# Patient Record
Sex: Male | Born: 1965 | Race: White | Hispanic: No | Marital: Single | Smoking: Never smoker
Health system: Southern US, Community
[De-identification: ages and names within clinical notes are randomized; demographics above are authoritative.]

---

## 2020-12-23 ENCOUNTER — Emergency Department (HOSPITAL_COMMUNITY)
Admission: EM | Admit: 2020-12-23 | Discharge: 2020-12-23 | Disposition: A | Discharge status: Home / Self Care | Payer: 59 | Attending: Emergency Medicine | Admitting: Emergency Medicine

## 2020-12-23 ENCOUNTER — Other Ambulatory Visit: Payer: Self-pay

## 2020-12-23 ENCOUNTER — Encounter (HOSPITAL_COMMUNITY): Payer: Self-pay

## 2020-12-23 ENCOUNTER — Emergency Department (HOSPITAL_COMMUNITY): Payer: 59

## 2020-12-23 DIAGNOSIS — M25562 Pain in left knee: Secondary | ICD-10-CM | POA: Diagnosis present

## 2020-12-23 NOTE — ED Triage Notes (Signed)
Pt reports he is here due to dislocation of left knee. Pt reports he was in the truck when his knee popped out of place. Pt  Reports this happen 3 years ago but it went back into place.

## 2020-12-23 NOTE — Discharge Instructions (Addendum)
As discussed, today's evaluation has been generally reassuring.  Please follow-up with our orthopedic surgery colleagues.

## 2020-12-23 NOTE — ED Provider Notes (Signed)
[facility] EMERGENCY DEPARTMENT Provider Note   CSN: [id number] Arrival date & time: 12/23/20  1400     History Chief Complaint  Patient presents with  . Dislocation    John Mccann is a 55 y.o. male.  HPI Presents with knee pain.  He notes a history of similar prior events.  However, the patient was in his usual state of health until day, just prior to ED arrival. He was stepping into his truck when he felt a pop in an awkward sensation in his left knee.  Since that time he has had diminished range of motion, with a catching sensation in the posterior lateral aspect. No fall, no other complaints.  Pain is minimal, aside when the patient is trying to ambulate on the leg. He has not seen an orthopedist, nor had surgery on the knee since the initial event which happened years ago.    History reviewed. No pertinent past medical history.  There are no problems to display for this patient.   History reviewed. No pertinent surgical history.     History reviewed. No pertinent family history.  Social History   Tobacco Use  . Smoking status: Never Smoker  . Smokeless tobacco: Never Used    Home Medications Prior to Admission medications   Not on File    Allergies    Patient has no allergy information on record.  Review of Systems   Review of Systems  Constitutional:       Per HPI, otherwise negative  HENT:       Per HPI, otherwise negative  Respiratory:       Per HPI, otherwise negative  Cardiovascular:       Per HPI, otherwise negative  Gastrointestinal: Negative for vomiting.  Endocrine:       Negative aside from HPI  Genitourinary:       Neg aside from HPI   Musculoskeletal:       Per HPI, otherwise negative  Skin: Negative.   Neurological: Negative for syncope.    Physical Exam Updated Vital Signs BP 124/83   Pulse 67   Temp 98.5 F (36.9 C) (Oral)   Resp 19   SpO2 97%   Physical Exam Vitals and nursing note reviewed.   Constitutional:      General: He is not in acute distress.    Appearance: He is well-developed.  HENT:     Head: Normocephalic and atraumatic.  Eyes:     Conjunctiva/sclera: Conjunctivae normal.  Cardiovascular:     Rate and Rhythm: Normal rate and regular rhythm.     Pulses: Normal pulses.  Pulmonary:     Effort: Pulmonary effort is normal. No respiratory distress.     Breath sounds: No stridor.  Abdominal:     General: There is no distension.  Musculoskeletal:     Comments: Left hip and ankle unremarkable.  Knee range of motion limited 160/100.  Mild tenderness palpation just medial to the left aspect of the posterior knee.  No deformity, no effusion.  Hamstring and quadriceps tendon function appropriate.  Skin:    General: Skin is warm and dry.  Neurological:     Mental Status: He is alert and oriented to person, place, and time.     ED Results / Procedures / Treatments   Labs (all labs ordered are listed, but only abnormal results are displayed) Labs Reviewed - No data to display  EKG EKG Interpretation  Date/Time:  Wednesday December 23 2020 14:27:32  EDT Ventricular Rate:  70 PR Interval:  165 QRS Duration: 102 QT Interval:  418 QTC Calculation: 451 R Axis:   58 Text Interpretation: Sinus rhythm Abnormal R-wave progression, early transition Otherwise within normal limits Confirmed by Gerhard Munch [phone]) on 12/23/2020 3:15:43 PM   Radiology DG Knee 2 Views Left  Result Date: 12/23/2020 CLINICAL DATA:  Pt reports he is here due to dislocation of left knee. Pt reports he was in the truck when his knee popped out of place EXAM: LEFT KNEE - 1-2 VIEW COMPARISON:  None. FINDINGS: No fracture or bone lesion. Knee joint is normally spaced and aligned. No degenerative/arthropathic change. No joint effusion. Soft tissues are unremarkable. IMPRESSION: Negative. Electronically Signed   By: Amie Portland M.D.   On: 12/23/2020 15:13    Procedures Procedures   Medications  Ordered in ED Medications - No data to display  ED Course  I have reviewed the triage vital signs and the nursing notes.  Pertinent labs & imaging results that were available during my care of the patient were reviewed by me and considered in my medical decision making (see chart for details).  3:17 PM On repeat exam the patient is in no distress.  Knee x-ray reviewed, discussed with patient, no dislocation, no fracture.  With preserved distal neurovascular status, appropriate function of hamstring and quadriceps tendon, some suspicion for cartilaginous disruption.  Patient appropriate for, amenable to orthopedics follow-up as an outpatient.  He was discharged in stable condition. Final Clinical Impression(s) / ED Diagnoses Final diagnoses:  Acute pain of left knee     Gerhard Munch, MD 12/23/20 1519

## 2021-10-28 IMAGING — CR DG KNEE 1-2V*L*
2 series · 2 of 2 positions shown · non-contrast
Comparison: None.

CLINICAL DATA: Pt reports he is here due to dislocation of left
knee. Pt reports he was in the truck when his knee popped out of
place

EXAM:
LEFT KNEE - 1-2 VIEW

[knee ap]
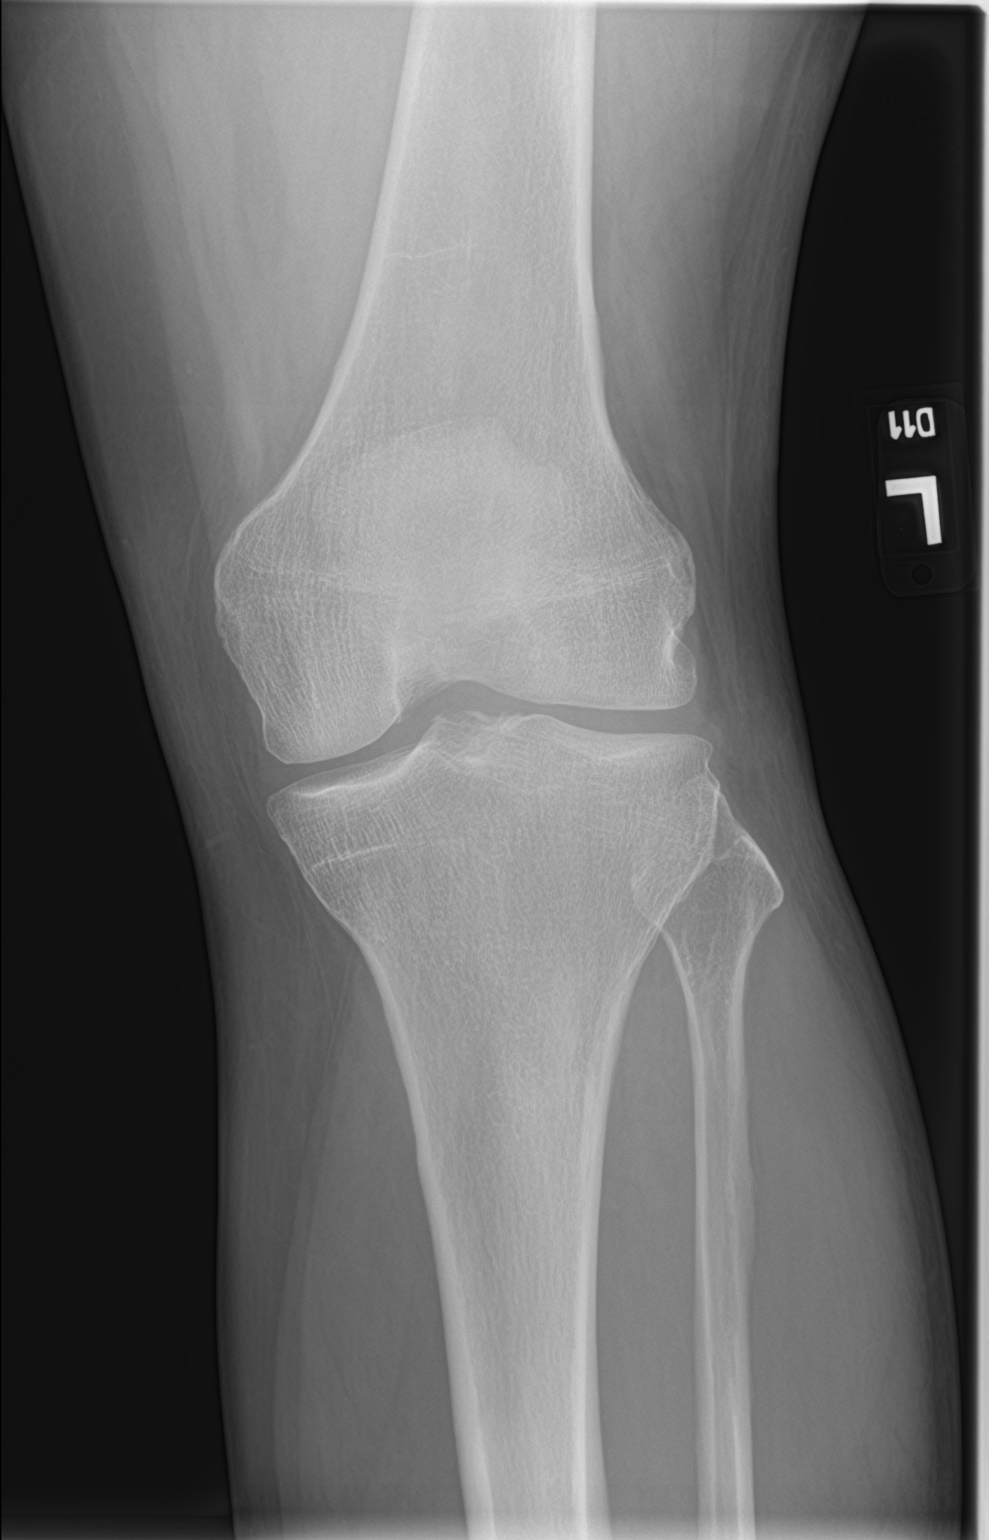

[knee lat]
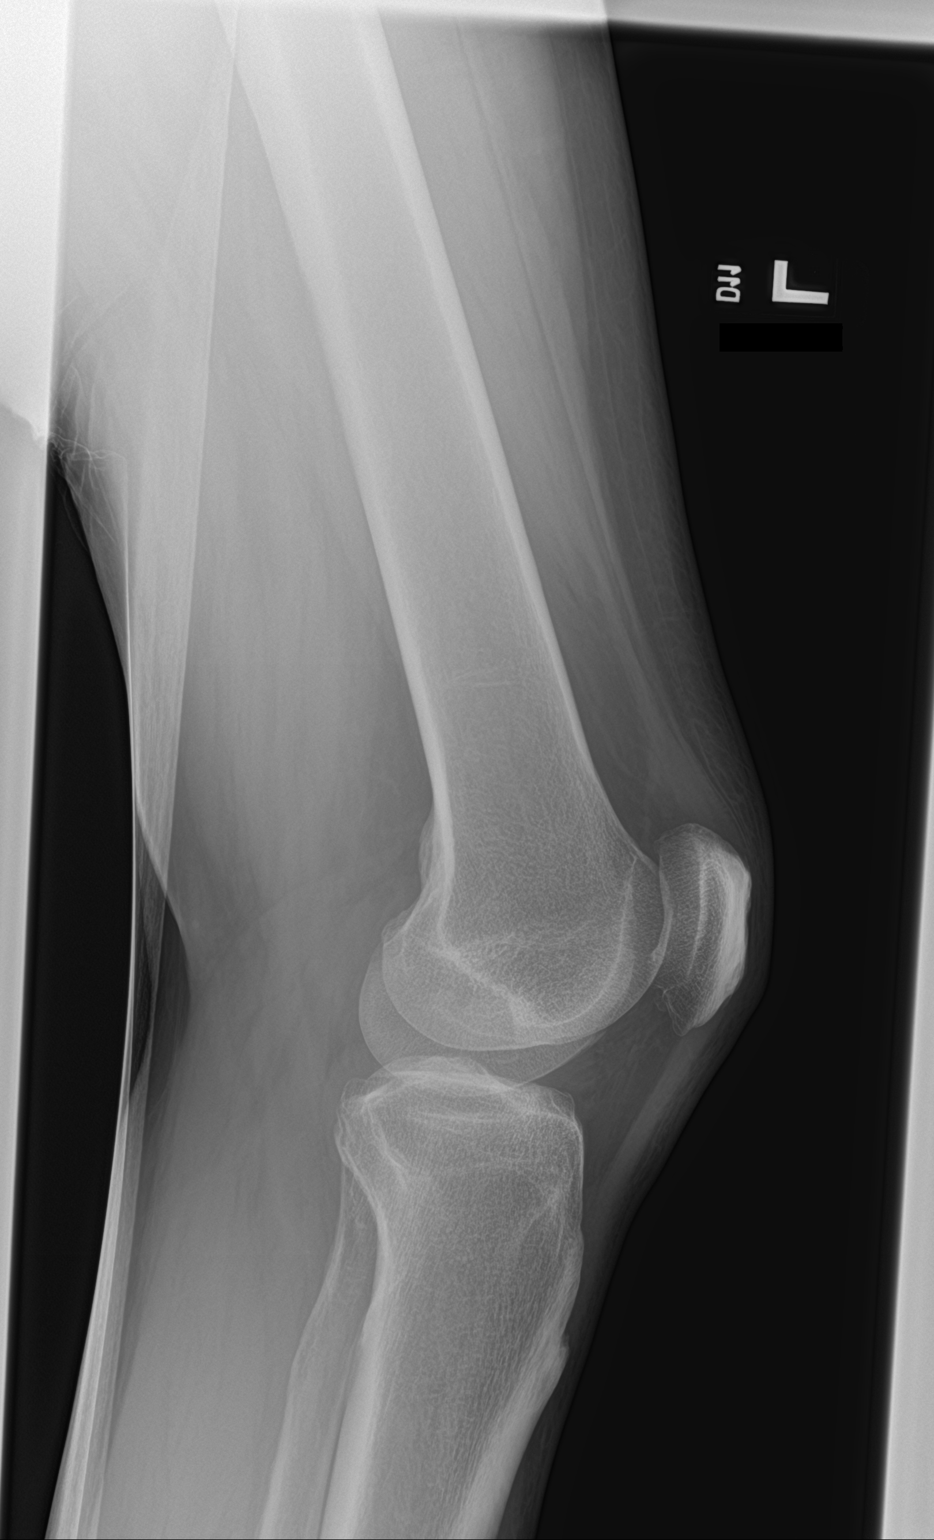

[2 of 2 positions shown; findings below may reference images not displayed]

FINDINGS: No fracture or bone lesion.

Knee joint is normally spaced and aligned. No
degenerative/arthropathic change. No joint effusion.

Soft tissues are unremarkable.
IMPRESSION: Negative.
